# Patient Record
Sex: Male | Born: 2000 | Race: White | Hispanic: No | Marital: Single | Smoking: Never smoker
Health system: Southern US, Community
[De-identification: ages and names within clinical notes are randomized; demographics above are authoritative.]

## PROBLEM LIST (undated history)

## (undated) DIAGNOSIS — K219 Gastro-esophageal reflux disease without esophagitis: Secondary | ICD-10-CM

## (undated) DIAGNOSIS — J309 Allergic rhinitis, unspecified: Secondary | ICD-10-CM

## (undated) DIAGNOSIS — F419 Anxiety disorder, unspecified: Secondary | ICD-10-CM

## (undated) DIAGNOSIS — F0781 Postconcussional syndrome: Secondary | ICD-10-CM

## (undated) DIAGNOSIS — F429 Obsessive-compulsive disorder, unspecified: Secondary | ICD-10-CM

## (undated) HISTORY — DX: Allergic rhinitis, unspecified: J30.9

## (undated) HISTORY — DX: Postconcussional syndrome: F07.81

## (undated) HISTORY — DX: Gastro-esophageal reflux disease without esophagitis: K21.9

## (undated) HISTORY — DX: Anxiety disorder, unspecified: F41.9

## (undated) HISTORY — DX: Obsessive-compulsive disorder, unspecified: F42.9

---
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
## 2021-01-30 ENCOUNTER — Other Ambulatory Visit: Payer: Self-pay

## 2021-01-30 ENCOUNTER — Encounter: Payer: Self-pay | Admitting: Psychiatry

## 2021-01-30 ENCOUNTER — Ambulatory Visit (INDEPENDENT_AMBULATORY_CARE_PROVIDER_SITE_OTHER): Payer: BC Managed Care – PPO | Admitting: Psychiatry

## 2021-01-30 DIAGNOSIS — F1210 Cannabis abuse, uncomplicated: Secondary | ICD-10-CM | POA: Diagnosis not present

## 2021-01-30 DIAGNOSIS — F411 Generalized anxiety disorder: Secondary | ICD-10-CM | POA: Diagnosis not present

## 2021-01-30 DIAGNOSIS — F422 Mixed obsessional thoughts and acts: Secondary | ICD-10-CM | POA: Diagnosis not present

## 2021-01-30 MED ORDER — FLUOXETINE HCL 40 MG PO CAPS: ORAL_CAPSULE | ORAL | 1 refills | Status: DC

## 2021-01-30 MED ORDER — BUPROPION HCL ER (XL) 150 MG PO TB24: 150.0000 mg | ORAL_TABLET | Freq: Every day | ORAL | 1 refills | Status: DC

## 2021-01-30 NOTE — Progress Notes (Signed)
Nixon Kolton [id number] 2000-11-10 19 y.o.  Subjective:   Patient ID:  Alfred Callahan is a 20 y.o. (DOB 01-Apr-2001) male.  Chief Complaint:  Chief Complaint  Patient presents with  . Follow-up    H/o depression and anxiety    HPI Alfred Callahan presents to the office today for follow-up of depression and anxiety. Pt previously seen by Dr. Marlyne Beards and care is being transferred to this provider due to Dr. Marlyne Beards' retirement.   He transferred from [employer] to [employer] last semester. Completed freshman year. He is undecided about his plans for the fall. Currently unemployed and is looking for a job and interviewed yesterday. He notices his mood is lower when he has less money. He reports that he also does better when he has a schedule and routine. He reports mood has been slightly lower. Energy and motivation have been lower. He reports that he is hopeful about the future. He reports occasional anxiety, such as when he is doing something new. He denies any worry other than about school. Denies any compulsions or obsessive thoughts. Denies panic s/s.  He has been sleeping excessively recently. Appetite has been ok. He reports that he is vegetarian. He reports that he has gained weight. Concentration has been ok. Denies SI.   Has a younger brother that is 5 years younger. Reports that they are not very close. Parents married around 28 years. Staying with parents over the summer and reports that this is going well.   Review of Systems:  Review of Systems  Gastrointestinal: Negative.   Musculoskeletal: Negative for gait problem.  Neurological: Negative for headaches.  Psychiatric/Behavioral:       Please refer to HPI    Medications: I have reviewed the patient's current medications.  Current Outpatient Medications  Medication Sig Dispense Refill  . fluticasone (FLONASE) 50 MCG/ACT nasal spray Place 2 sprays into the nose as needed.    . loratadine (CLARITIN) 10 MG tablet Take 10 mg by mouth  daily as needed.    Marland Kitchen buPROPion (WELLBUTRIN XL) 150 MG 24 hr tablet Take 1 tablet (150 mg total) by mouth daily after breakfast. 90 tablet 1  . FLUoxetine (PROZAC) 40 MG capsule TAKE 1 CAPSULE IN THE MORNING AFTER BREAKFAST. 90 capsule 1   No current facility-administered medications for this visit.    Medication Side Effects: None  Allergies: No Known Allergies  Past Medical History:  Diagnosis Date  . Allergic rhinitis   . Anxiety   . Concussion syndrome 5th Grade   When swinging for the next year though swinging otherwise always calming  . GERD (gastroesophageal reflux disease)   . Obsessive-compulsive disorder     Past Medical History, Surgical history, Social history, and Family history were reviewed and updated as appropriate.   Please see review of systems for further details on the patient's review from today.   Objective:   Physical Exam:  Wt 197 lb (89.4 kg)   BMI 29.95 kg/m   Physical Exam Constitutional:      General: He is not in acute distress. Musculoskeletal:        General: No deformity.  Neurological:     Mental Status: He is alert and oriented to person, place, and time.     Coordination: Coordination normal.  Psychiatric:        Attention and Perception: Attention and perception normal. He does not perceive auditory or visual hallucinations.        Mood and Affect: Mood normal. Mood is not  anxious or depressed. Affect is not labile, blunt, angry or inappropriate.        Speech: Speech normal.        Behavior: Behavior normal.        Thought Content: Thought content normal. Thought content is not paranoid or delusional. Thought content does not include homicidal or suicidal ideation. Thought content does not include homicidal or suicidal plan.        Cognition and Memory: Cognition and memory normal.        Judgment: Judgment normal.     Comments: Insight intact     Lab Review:  No results found for: NA, K, CL, CO2, GLUCOSE, BUN, CREATININE,  CALCIUM, PROT, ALBUMIN, AST, ALT, ALKPHOS, BILITOT, GFRNONAA, GFRAA  No results found for: WBC, RBC, HGB, HCT, PLT, MCV, MCH, MCHC, RDW, LYMPHSABS, MONOABS, EOSABS, BASOSABS  No results found for: POCLITH, LITHIUM   No results found for: PHENYTOIN, PHENOBARB, VALPROATE, CBMZ   .res Assessment: Plan:   Will continue current plan of care since target signs and symptoms are well controlled without any tolerability issues. Continue Wellbutrin XL 150 mg po qd for depression.  Continue Prozac 40 mg po qd for depression and anxiety.  Pt to follow-up in 6 months or sooner if clinically indicated.  Patient advised to contact office with any questions, adverse effects, or acute worsening in signs and symptoms.  Alfred Callahan was seen today for follow-up.  Diagnoses and all orders for this visit:  Generalized anxiety disorder -     FLUoxetine (PROZAC) 40 MG capsule; TAKE 1 CAPSULE IN THE MORNING AFTER BREAKFAST. -     buPROPion (WELLBUTRIN XL) 150 MG 24 hr tablet; Take 1 tablet (150 mg total) by mouth daily after breakfast.  Mixed obsessional thoughts and acts -     FLUoxetine (PROZAC) 40 MG capsule; TAKE 1 CAPSULE IN THE MORNING AFTER BREAKFAST.  Cannabis use disorder, mild, abuse -     buPROPion (WELLBUTRIN XL) 150 MG 24 hr tablet; Take 1 tablet (150 mg total) by mouth daily after breakfast.     Please see After Visit Summary for patient specific instructions.  No future appointments.  No orders of the defined types were placed in this encounter.   -------------------------------

## 2021-08-01 ENCOUNTER — Ambulatory Visit: Payer: BC Managed Care – PPO | Admitting: Psychiatry

## 2021-08-01 ENCOUNTER — Encounter: Payer: Self-pay | Admitting: Psychiatry

## 2021-08-01 ENCOUNTER — Other Ambulatory Visit: Payer: Self-pay

## 2021-08-01 DIAGNOSIS — F411 Generalized anxiety disorder: Secondary | ICD-10-CM | POA: Diagnosis not present

## 2021-08-01 DIAGNOSIS — F422 Mixed obsessional thoughts and acts: Secondary | ICD-10-CM | POA: Diagnosis not present

## 2021-08-01 MED ORDER — FLUOXETINE HCL 40 MG PO CAPS: ORAL_CAPSULE | ORAL | 1 refills | Status: DC

## 2021-08-01 NOTE — Progress Notes (Signed)
Marcelle Bebout [id number] [birth year] 20 y.o.  Subjective:   Patient ID:  Alfred Callahan is a 20 y.o. (DOB Nov 19, 2000) male.  Chief Complaint:  Chief Complaint  Patient presents with   Follow-up    H/o Depression and anxiety    HPI Alfred Callahan presents to the office today for follow-up of anxiety and depression. He is accompanied by his mother. He has been seeing therapist, Glendell Docker, and reports this has been helpful.   Reports that he has been trying to reduce marijuana. He reports that he used it to help with sleep. He reports that he has had withdrawal from marijuana if he stops it abruptly. He reports that he initially started using it for anxiety and then continued regardless of anxiety. He reports that he is able to sleep without marijuna if he has been been active.   Has been sleeping well and at times too much. He denies any recent anxiety. He reports that in the past school was the main trigger for his anxiety. He report sthat he has had some depression in response "to overall situation." He notices some sadness at times. He reports that his motivation has increased. Energy is increased and reports that he is "approaching things differently." He would like to lose weight. Appetite has been good. He reports that he is a very picky eater. Concentration is improving. He is able to focus on a task. Denies SI.   Has been enjoying time with friends. Denies affective dulling.    They report that they would like for him to come off Wellbutrin XL and have been taking 1/2 tablet for the last 2 weeks. They would like for him to continue Prozac.   Working at a [employer].   Review of Systems:  Review of Systems  Gastrointestinal: Negative.   Musculoskeletal:  Negative for gait problem.  Neurological:  Negative for headaches.  Psychiatric/Behavioral:         Please refer to HPI   Medications: I have reviewed the patient's current medications.  Current Outpatient Medications   Medication Sig Dispense Refill   buPROPion (WELLBUTRIN XL) 150 MG 24 hr tablet Take 1 tablet (150 mg total) by mouth daily after breakfast. (Patient taking differently: Take 75 mg by mouth daily after breakfast.) 90 tablet 1   FLUoxetine (PROZAC) 40 MG capsule TAKE 1 CAPSULE IN THE MORNING AFTER BREAKFAST. 90 capsule 1   fluticasone (FLONASE) 50 MCG/ACT nasal spray Place 2 sprays into the nose as needed.     loratadine (CLARITIN) 10 MG tablet Take 10 mg by mouth daily as needed.     No current facility-administered medications for this visit.    Medication Side Effects: None  Allergies: No Known Allergies  Past Medical History:  Diagnosis Date   Allergic rhinitis    Anxiety    Concussion syndrome 5th Grade   When swinging for the next year though swinging otherwise always calming   GERD (gastroesophageal reflux disease)    Obsessive-compulsive disorder     Past Medical History, Surgical history, Social history, and Family history were reviewed and updated as appropriate.   Please see review of systems for further details on the patient's review from today.   Objective:   Physical Exam:  There were no vitals taken for this visit.  Physical Exam Constitutional:      General: He is not in acute distress. Musculoskeletal:        General: No deformity.  Neurological:     Mental Status: He is  alert and oriented to person, place, and time.     Coordination: Coordination normal.  Psychiatric:        Attention and Perception: Attention and perception normal. He does not perceive auditory or visual hallucinations.        Mood and Affect: Mood normal. Mood is not anxious or depressed. Affect is not labile, blunt, angry or inappropriate.        Speech: Speech normal.        Behavior: Behavior normal.        Thought Content: Thought content normal. Thought content is not paranoid or delusional. Thought content does not include homicidal or suicidal ideation. Thought content does not  include homicidal or suicidal plan.        Cognition and Memory: Cognition and memory normal.        Judgment: Judgment normal.     Comments: Insight intact    Lab Review:  No results found for: NA, K, CL, CO2, GLUCOSE, BUN, CREATININE, CALCIUM, PROT, ALBUMIN, AST, ALT, ALKPHOS, BILITOT, GFRNONAA, GFRAA  No results found for: WBC, RBC, HGB, HCT, PLT, MCV, MCH, MCHC, RDW, LYMPHSABS, MONOABS, EOSABS, BASOSABS  No results found for: POCLITH, LITHIUM   No results found for: PHENYTOIN, PHENOBARB, VALPROATE, CBMZ   .res Assessment: Plan:   Pt seen for 30 minutes and time spent discussing discontinuation of Wellbutrin XL since benefit has been unclear. Will stop Wellbutrin XL.  Will continue Prozac 40 mg po qd for mood and anxiety s/s and recommended continuing Prozac for 9 months for 9 months to a year after depression has been in remission to minimize risk of recurrence.  Pt to follow-up in 6 months or sooner if clinically indicated.  Patient advised to contact office with any questions, adverse effects, or acute worsening in signs and symptoms.  Alfred Callahan was seen today for follow-up.  Diagnoses and all orders for this visit:  Generalized anxiety disorder -     FLUoxetine (PROZAC) 40 MG capsule; TAKE 1 CAPSULE IN THE MORNING AFTER BREAKFAST.  Mixed obsessional thoughts and acts -     FLUoxetine (PROZAC) 40 MG capsule; TAKE 1 CAPSULE IN THE MORNING AFTER BREAKFAST.    Please see After Visit Summary for patient specific instructions.  Future Appointments  Date Time Provider Department Center  01/30/2022 12:45 PM Corie Chiquito, PMHNP [facility] None    No orders of the defined types were placed in this encounter.   -------------------------------

## 2022-01-30 ENCOUNTER — Ambulatory Visit: Payer: BC Managed Care – PPO | Admitting: Psychiatry

## 2022-02-13 ENCOUNTER — Ambulatory Visit: Payer: BC Managed Care – PPO | Admitting: Psychiatry

## 2022-02-18 ENCOUNTER — Telehealth (INDEPENDENT_AMBULATORY_CARE_PROVIDER_SITE_OTHER): Payer: BC Managed Care – PPO | Admitting: Psychiatry

## 2022-02-18 ENCOUNTER — Encounter: Payer: Self-pay | Admitting: Psychiatry

## 2022-02-18 VITALS — Wt 195.0 lb

## 2022-02-18 DIAGNOSIS — F422 Mixed obsessional thoughts and acts: Secondary | ICD-10-CM | POA: Diagnosis not present

## 2022-02-18 DIAGNOSIS — F4010 Social phobia, unspecified: Secondary | ICD-10-CM

## 2022-02-18 DIAGNOSIS — F411 Generalized anxiety disorder: Secondary | ICD-10-CM

## 2022-02-18 MED ORDER — FLUOXETINE HCL 40 MG PO CAPS: ORAL_CAPSULE | ORAL | 1 refills | Status: DC

## 2022-02-18 MED ORDER — PROPRANOLOL HCL 10 MG PO TABS: ORAL_TABLET | ORAL | 1 refills | Status: AC

## 2022-02-18 NOTE — Progress Notes (Signed)
Alfred Callahan [id number] Jan 21, 2001 21 y.o.  Virtual Visit via Video Note  I connected with pt @ on 02/18/22 at  9:00 AM EDT by a video enabled telemedicine application and verified that I am speaking with the correct person using two identifiers.   I discussed the limitations of evaluation and management by telemedicine and the availability of in person appointments. The patient expressed understanding and agreed to proceed.  I discussed the assessment and treatment plan with the patient. The patient was provided an opportunity to ask questions and all were answered. The patient agreed with the plan and demonstrated an understanding of the instructions.   The patient was advised to call back or seek an in-person evaluation if the symptoms worsen or if the condition fails to improve as anticipated.  I provided 30 minutes of non-face-to-face time during this encounter.  The patient was located at home.  The provider was located at home.   Corie Chiquito, PMHNP   Subjective:   Patient ID:  Alfred Callahan is a 21 y.o. (DOB July 21, 2001) male.  Chief Complaint:  Chief Complaint  Patient presents with   Other    Difficulty with concentration and focus   Anxiety   Follow-up    Depression    HPI [name] presents for follow-up of anxiety, depression and possible ADHD. He reports that he has been "doing well." He denies any change since stopping Wellbutrin XL. He reports that he has had some "cycles" of depression throughout the day. He reports "lately I have been doing way better."   He had some depression a couple of months ago in response to a possible legal issue. He stopped smoking marijuana 28 days ago. He reports that he had minimal withdrawal symptoms. He reports that his mood and anxiety have improved since stopping marijuana.   He reports that he has some anxiety and "it's pretty much controllable." He will notice his hands sweating at times due to anxiety, such as going to  a social event or meeting new people. He denies excessive worry. He reports that his worry will likely increase when he gets back in school since he gets anxious with sitting in a classroom. He reports that he has social anxiety when he is in a classroom. Denies obsessive thoughts or compulsions. He reports that he is "sleeping relatively ok" and has been staying up late and then sleeping later. He is sleeping 8-9 hours a night. He reports that he has periods of low energy. He reports that his motivation is ok. He reports that his concentration has been "ok." He reports, "I have undiagnosed ADHD but I have learned to combat it." He reports difficulty sitting in one place. He reports difficulty sustaining focus and is easily distracted. Appetite has been good. Denies SI.   He has been working and not going to school currently. He plans to attend [employer] in the fall.   Continues to see therapist regularly.   Review of Systems:  Review of Systems  Cardiovascular:  Negative for palpitations.  Musculoskeletal:  Negative for gait problem.  Neurological:  Negative for tremors.  Psychiatric/Behavioral:         Please refer to HPI   Medications: I have reviewed the patient's current medications.  Current Outpatient Medications  Medication Sig Dispense Refill   fluticasone (FLONASE) 50 MCG/ACT nasal spray Place 2 sprays into the nose as needed.     propranolol (INDERAL) 10 MG tablet Take 1-2 tabs po BID prn anxiety 120 tablet 1  FLUoxetine (PROZAC) 40 MG capsule TAKE 1 CAPSULE IN THE MORNING AFTER BREAKFAST. 90 capsule 1   loratadine (CLARITIN) 10 MG tablet Take 10 mg by mouth daily as needed. (Patient not taking: Reported on 02/18/2022)     No current facility-administered medications for this visit.    Medication Side Effects: None  Allergies: No Known Allergies  Past Medical History:  Diagnosis Date   Allergic rhinitis    Anxiety    Concussion syndrome 5th Grade   When swinging for the next  year though swinging otherwise always calming   GERD (gastroesophageal reflux disease)    Obsessive-compulsive disorder     Family History  Problem Relation Age of Onset   Anxiety disorder Mother    Bipolar disorder Mother    Depression Mother    OCD Mother    Diabetes Mellitus I Father    Bipolar disorder Maternal Grandmother    Depression Maternal Grandmother    Depression Paternal Grandfather    Psychosis Paternal Grandfather    Post-traumatic stress disorder Paternal Grandfather     Social History   Socioeconomic History   Marital status: Single    Spouse name: Not on file   Number of children: Not on file   Years of education: Not on file   Highest education level: 10th grade  Occupational History   Not on file  Tobacco Use   Smoking status: Never   Smokeless tobacco: Never  Vaping Use   Vaping Use: Never used  Substance and Sexual Activity   Alcohol use: Yes    Comment: Rare   Drug use: Not Currently    Types: Marijuana    Comment: Weekends only now   Sexual activity: Never  Other Topics Concern   Not on file  Social History Narrative   11th grade student at [employer] with mostly AP classes and good grades in track last year staying active in good shape and community basketball able to run the court constantly with good endurance now saturated by anxiety ready for help.  He is most similar to mother who sees Dr. Toni Arthurs for Wellbutirin and Prozac with much benefit from Prozac so having OCD and cyclothymic features.  Maternal grandmother with him and Lexapro for bipolar and depression symptoms more reason for treatment to help school and future quality of life.   Social Determinants of Health   Financial Resource Strain: Not on file  Food Insecurity: Not on file  Transportation Needs: Not on file  Physical Activity: Not on file  Stress: Not on file  Social Connections: Not on file  Intimate Partner Violence: Not on file    Past Medical History,  Surgical history, Social history, and Family history were reviewed and updated as appropriate.   Please see review of systems for further details on the patient's review from today.   Objective:   Physical Exam:  Wt 195 lb (88.5 kg)   BMI 29.65 kg/m   Physical Exam Neurological:     Mental Status: He is alert and oriented to person, place, and time.     Cranial Nerves: No dysarthria.  Psychiatric:        Attention and Perception: Attention and perception normal.        Mood and Affect: Mood is anxious.        Speech: Speech normal.        Behavior: Behavior is cooperative.        Thought Content: Thought content normal. Thought content is not  paranoid or delusional. Thought content does not include homicidal or suicidal ideation. Thought content does not include homicidal or suicidal plan.        Cognition and Memory: Cognition and memory normal.        Judgment: Judgment normal.     Comments: Insight intact    Lab Review:  No results found for: NA, K, CL, CO2, GLUCOSE, BUN, CREATININE, CALCIUM, PROT, ALBUMIN, AST, ALT, ALKPHOS, BILITOT, GFRNONAA, GFRAA  No results found for: WBC, RBC, HGB, HCT, PLT, MCV, MCH, MCHC, RDW, LYMPHSABS, MONOABS, EOSABS, BASOSABS  No results found for: POCLITH, LITHIUM   No results found for: PHENYTOIN, PHENOBARB, VALPROATE, CBMZ   .res Assessment: Plan:    Pt seen for 30 minutes and time spent discussing ADHD s/s and administering Adult ADHD self-report scale symptom questionnaire. Pt endorses several symptoms consistent with ADHD. Discussed collaborating with his therapist to discuss therapist's observations and diagnosis. Pt agrees to signing information release for provider to talk with his therapist. Will mail information release to pt's house. He reports that he would like to manage ADHD s/s without medication at this time but may consider treatment for ADHD in the future if having difficulty managing his symptoms.  Also discussed that he is  describing s/s consistent with social anxiety disorder. Discussed potential benefits, risks, and side effects of Propranolol. Discussed that Propranolol can be used as needed for anxiety signs and symptoms. Patient agrees to trial of Propranolol. Will start Propranolol 10 mg 1-2 tabs po BID prn anxiety.  Recommended that he consider academic accommodations for the fall semester and that this provider and his therapist may be able to assist with this.  Will continue Prozac 40 mg po qd for anxiety and depression.  Pt to follow-up in 4 months or sooner if clinically indicated.  Recommend continuing therapy.  Patient advised to contact office with any questions, adverse effects, or acute worsening in signs and symptoms.  Anette Riedeloah was seen today for other, anxiety and follow-up.  Diagnoses and all orders for this visit:  Generalized anxiety disorder -     FLUoxetine (PROZAC) 40 MG capsule; TAKE 1 CAPSULE IN THE MORNING AFTER BREAKFAST.  Social anxiety disorder -     propranolol (INDERAL) 10 MG tablet; Take 1-2 tabs po BID prn anxiety  Mixed obsessional thoughts and acts -     FLUoxetine (PROZAC) 40 MG capsule; TAKE 1 CAPSULE IN THE MORNING AFTER BREAKFAST.     Please see After Visit Summary for patient specific instructions.  No future appointments.  No orders of the defined types were placed in this encounter.     -------------------------------

## 2022-11-13 ENCOUNTER — Other Ambulatory Visit: Payer: Self-pay | Admitting: Psychiatry

## 2022-11-13 DIAGNOSIS — F411 Generalized anxiety disorder: Secondary | ICD-10-CM

## 2022-11-13 DIAGNOSIS — F422 Mixed obsessional thoughts and acts: Secondary | ICD-10-CM

## 2022-11-15 NOTE — Telephone Encounter (Signed)
Please call to schedule an appt.  

## 2022-11-19 NOTE — Telephone Encounter (Signed)
[facility] @ 1:25p.  They were calling about the 90 day script for the Fluoxetine '40mg'$ . I called pt and [facility] @ 1:40p to call and schedule appt.

## 2023-08-04 ENCOUNTER — Encounter: Payer: Self-pay | Admitting: Psychiatry
